# Patient Record
Sex: Female | Born: 1975 | Hispanic: No | Marital: Married | Smoking: Never smoker
Health system: Southern US, Community
[De-identification: ages and names within clinical notes are randomized; demographics above are authoritative.]

## PROBLEM LIST (undated history)

## (undated) HISTORY — PX: WRIST SURGERY: SHX841

---

## 2003-02-08 ENCOUNTER — Other Ambulatory Visit: Admission: RE | Admit: 2003-02-08 | Discharge: 2003-02-08 | Payer: Self-pay | Admitting: Gynecology

## 2004-02-29 ENCOUNTER — Other Ambulatory Visit: Admission: RE | Admit: 2004-02-29 | Discharge: 2004-02-29 | Payer: Self-pay | Admitting: Gynecology

## 2005-03-18 ENCOUNTER — Other Ambulatory Visit: Admission: RE | Admit: 2005-03-18 | Discharge: 2005-03-18 | Payer: Self-pay | Admitting: Gynecology

## 2007-05-14 ENCOUNTER — Emergency Department (HOSPITAL_COMMUNITY): Admission: EM | Admit: 2007-05-14 | Discharge: 2007-05-14 | Payer: Self-pay | Admitting: Emergency Medicine

## 2016-04-02 ENCOUNTER — Emergency Department (HOSPITAL_BASED_OUTPATIENT_CLINIC_OR_DEPARTMENT_OTHER): Payer: Managed Care, Other (non HMO)

## 2016-04-02 ENCOUNTER — Emergency Department (HOSPITAL_BASED_OUTPATIENT_CLINIC_OR_DEPARTMENT_OTHER)
Admission: EM | Admit: 2016-04-02 | Discharge: 2016-04-03 | Disposition: A | Discharge status: Home / Self Care | Payer: Managed Care, Other (non HMO) | Attending: Emergency Medicine | Admitting: Emergency Medicine

## 2016-04-02 ENCOUNTER — Encounter (HOSPITAL_BASED_OUTPATIENT_CLINIC_OR_DEPARTMENT_OTHER): Payer: Self-pay | Admitting: *Deleted

## 2016-04-02 DIAGNOSIS — R3915 Urgency of urination: Secondary | ICD-10-CM | POA: Insufficient documentation

## 2016-04-02 DIAGNOSIS — M545 Low back pain: Secondary | ICD-10-CM | POA: Diagnosis present

## 2016-04-02 DIAGNOSIS — N939 Abnormal uterine and vaginal bleeding, unspecified: Secondary | ICD-10-CM | POA: Insufficient documentation

## 2016-04-02 DIAGNOSIS — R109 Unspecified abdominal pain: Secondary | ICD-10-CM | POA: Diagnosis not present

## 2016-04-02 DIAGNOSIS — N949 Unspecified condition associated with female genital organs and menstrual cycle: Secondary | ICD-10-CM

## 2016-04-02 DIAGNOSIS — R110 Nausea: Secondary | ICD-10-CM | POA: Diagnosis not present

## 2016-04-02 LAB — URINE MICROSCOPIC-ADD ON

## 2016-04-02 LAB — URINALYSIS, ROUTINE W REFLEX MICROSCOPIC
Bilirubin Urine: NEGATIVE
GLUCOSE, UA: NEGATIVE mg/dL
Ketones, ur: NEGATIVE mg/dL
Nitrite: NEGATIVE
PH: 7 (ref 5.0–8.0)
Protein, ur: NEGATIVE mg/dL
Specific Gravity, Urine: 1.015 (ref 1.005–1.030)

## 2016-04-02 LAB — CBC WITH DIFFERENTIAL/PLATELET
BASOS PCT: 0 %
Basophils Absolute: 0 10*3/uL (ref 0.0–0.1)
EOS ABS: 0.2 10*3/uL (ref 0.0–0.7)
EOS PCT: 3 %
HCT: 41.2 % (ref 36.0–46.0)
HEMOGLOBIN: 13.7 g/dL (ref 12.0–15.0)
Lymphocytes Relative: 23 %
Lymphs Abs: 1.9 10*3/uL (ref 0.7–4.0)
MCH: 29.5 pg (ref 26.0–34.0)
MCHC: 33.3 g/dL (ref 30.0–36.0)
MCV: 88.8 fL (ref 78.0–100.0)
MONOS PCT: 10 %
Monocytes Absolute: 0.8 10*3/uL (ref 0.1–1.0)
Neutro Abs: 5.3 10*3/uL (ref 1.7–7.7)
Neutrophils Relative %: 64 %
PLATELETS: 372 10*3/uL (ref 150–400)
RBC: 4.64 MIL/uL (ref 3.87–5.11)
RDW: 13.3 % (ref 11.5–15.5)
WBC: 8.2 10*3/uL (ref 4.0–10.5)

## 2016-04-02 LAB — PREGNANCY, URINE: Preg Test, Ur: NEGATIVE

## 2016-04-02 NOTE — ED Triage Notes (Signed)
Back pain today. Urgency. [facility] She finished Macrobid yesterday.

## 2016-04-02 NOTE — ED Provider Notes (Signed)
[facility] Provider Note   CSN: [id number] Arrival date & time: 04/02/16  4098  By signing my name below, I, Emmanuella Mensah, attest that this documentation has been prepared under the direction and in the presence of Demetrios Loll, PA-C. Electronically Signed: Angelene Giovanni, ED Scribe. 04/02/16. 11:09 PM.   History   Chief Complaint Chief Complaint  Patient presents with  . Back Pain    HPI Comments: Brandy Estrada is a 40 y.o. female who presents to the Emergency Department complaining of persistent moderate urinary urgency and little urinary output onset 10 days ago. She explains that she was seen by a provider at an UC one week ago for these symptoms where she was given Macrobid. She states that she is here today because although she was complaint with the Macrobid and finished it yesterday, she had sudden onset of left lower back pain at 5:30 pm today. She adds that she had associated nausea. No alleviating factors noted. She states that she tried an OTC AZO which provided relief of her back pain and she was able to urinate here in the ED. Pt is currently on her menstrual cycle. She has NKDA. Denies any history of nephrolithiasis, or ovarian cyst. States she had UTI before and this is not as bad. She denies any fever, chills, ha, vision changes, the dizziness, lightheadedness, chest pain, shortness of breath, abdominal pain,emesis,dysuria, hematuria change in bowel habits, numbness/tingling, vaginal bleeding, vaginal discharge. Patient denies any pain or symptoms at this time.  The history is provided by the patient. No language interpreter was used.    History reviewed. No pertinent past medical history.  There are no active problems to display for this patient.   Past Surgical History:  Procedure Laterality Date  . WRIST SURGERY      OB History    No data available       Home Medications    Prior to Admission medications   Not on File    Family  History No family history on file.  Social History Social History  Substance Use Topics  . Smoking status: Never Smoker  . Smokeless tobacco: Never Used  . Alcohol use No     Allergies   Review of patient's allergies indicates no known allergies.   Review of Systems Review of Systems  Constitutional: Negative for chills and fever.  HENT: Negative for congestion and rhinorrhea.   Eyes: Negative for pain.  Respiratory: Negative for cough and shortness of breath.   Cardiovascular: Negative for chest pain and palpitations.  Gastrointestinal: Positive for nausea. Negative for abdominal pain and vomiting.  Genitourinary: Positive for flank pain, frequency, urgency and vaginal bleeding (On mestrual cycle). Negative for dysuria, hematuria and vaginal discharge.  Neurological: Negative for dizziness, syncope, light-headedness, numbness and headaches.  All other systems reviewed and are negative.    Physical Exam Updated Vital Signs BP 156/98 (BP Location: Right Arm)   Pulse 95   Temp 98.1 F (36.7 C) (Oral)   Resp 18   Ht 5\' 2"  (1.575 m)   Wt 244 lb (110.7 kg)   LMP 03/30/2016   SpO2 100%   BMI 44.63 kg/m   Physical Exam  Constitutional: She is oriented to person, place, and time. She appears well-developed and well-nourished. No distress.  HENT:  Head: Normocephalic and atraumatic.  Mouth/Throat: Uvula is midline, oropharynx is clear and moist and mucous membranes are normal.  Eyes: Conjunctivae and EOM are normal. Right eye exhibits no discharge. Left eye  exhibits no discharge. No scleral icterus.  Neck: Normal range of motion. Neck supple. No thyromegaly present.  Cardiovascular: Normal rate, regular rhythm, normal heart sounds and intact distal pulses.  Exam reveals no gallop and no friction rub.   No murmur heard. Pulmonary/Chest: Effort normal and breath sounds normal. No respiratory distress. She has no wheezes.  Abdominal: Soft. Bowel sounds are normal. She  exhibits no distension. There is no tenderness. There is no rebound, no guarding and no CVA tenderness.  Musculoskeletal: Normal range of motion.  Lymphadenopathy:    She has no cervical adenopathy.  Neurological: She is alert and oriented to person, place, and time.  Skin: Skin is warm and dry.  Psychiatric: She has a normal mood and affect. Judgment normal.  Nursing note and vitals reviewed.    ED Treatments / Results  DIAGNOSTIC STUDIES: Oxygen Saturation is 100% on RA, normal by my interpretation.    COORDINATION OF CARE: 11:08 PM- Pt advised of plan for treatment and pt agrees. Pt informed of her lab results. She will receive CT renal stone study for further evaluation.    Labs (all labs ordered are listed, but only abnormal results are displayed) Labs Reviewed  URINALYSIS, ROUTINE W REFLEX MICROSCOPIC (NOT AT [facility]) - Abnormal; Notable for the following:       Result Value   Hgb urine dipstick MODERATE (*)    Leukocytes, UA TRACE (*)    All other components within normal limits  URINE MICROSCOPIC-ADD ON - Abnormal; Notable for the following:    Squamous Epithelial / LPF 0-5 (*)    Bacteria, UA RARE (*)    All other components within normal limits  PREGNANCY, URINE  BASIC METABOLIC PANEL  CBC WITH DIFFERENTIAL/PLATELET    EKG  EKG Interpretation None       Radiology Ct Renal Stone Study  Result Date: 04/02/2016 CLINICAL DATA:  Acute onset of left lower back pain. Initial encounter. EXAM: CT ABDOMEN AND PELVIS WITHOUT CONTRAST TECHNIQUE: Multidetector CT imaging of the abdomen and pelvis was performed following the standard protocol without IV contrast. COMPARISON:  Lumbar spine radiographs performed 05/20/2007 FINDINGS: Lower chest: The visualized lung bases are grossly clear. The visualized portions of the mediastinum are unremarkable. Hepatobiliary: The liver is unremarkable in appearance. Stones are noted dependently within the gallbladder. The gallbladder is  otherwise unremarkable. The common bile duct remains normal in caliber. Pancreas: The pancreas is within normal limits. Spleen: The spleen is unremarkable in appearance. Adrenals/Urinary Tract: The adrenal glands are unremarkable in appearance. The kidneys are within normal limits. There is no evidence of hydronephrosis. No renal or ureteral stones are identified. No perinephric stranding is seen. Stomach/Bowel: The stomach is unremarkable in appearance. The small bowel is within normal limits. The appendix is normal in caliber, without evidence of appendicitis. The colon is unremarkable in appearance. Vascular/Lymphatic: The abdominal aorta is unremarkable in appearance. The inferior vena cava is grossly unremarkable. No retroperitoneal lymphadenopathy is seen. No pelvic sidewall lymphadenopathy is identified. Reproductive: The bladder is decompressed and not well assessed. The uterus is grossly unremarkable in appearance. A large 10.8 cm right adnexal cystic lesion is noted. The left ovary is unremarkable in appearance. Other: No additional soft tissue abnormalities are seen. Musculoskeletal: No acute osseous abnormalities are identified. The visualized musculature is unremarkable in appearance. IMPRESSION: 1. No definite acute abnormality seen to explain the patient's symptoms. 2. Large 10.8 cm right adnexal cystic lesion noted. Pelvic ultrasound is recommended for further evaluation, when and as  deemed clinically appropriate. 3. Cholelithiasis; gallbladder otherwise unremarkable. Electronically Signed   By: Roanna Raider M.D.   On: 04/02/2016 23:55    Procedures Procedures (including critical care time)  Medications Ordered in ED Medications - No data to display   Initial Impression / Assessment and Plan / ED Course  Demetrios Loll, PA-C has reviewed the triage vital signs and the nursing notes.  Pertinent labs & imaging results that were available during my care of the patient were reviewed by  me and considered in my medical decision making (see chart for details).  Clinical Course  Patient presents to ED today with urinary urgency. Patient with history of same and seen at urgent care last week. Encouraged to follow up with PCP but has not done so.patient with left flank pain today.Concern for nephrolithiasis. uA was without signs of infection. Blood was noted. Patient is currently on menstrual period. Pregnancy test was negative. Lab were unremarkable. Ct was without renal calculi. There was a large adnexal cyst seen on Ct. Patient with history of same but not sure of size of cyst. Cyst this large could be putting pressure on bladder causing urinary urgency and frequency. She has not seen ob/gyn in "years". Patient would like ultrasound of pelvis today. Patient given prescription for outpatient [place] today. She is encouraged to follow up with her ob/gyn with results. Patient also informed of cholelithiasis. Encouraged to follow up with pcp. If symptoms persists she needs to follow up with pcp. Dicussed patient with Dr. Read Drivers who is in agreeable with plan. Patient with elevated bp no history of hnt. Encouraged to follow up with pcp. Hemodynamically stable. Patient verbalized understanding with plan of care. Discharged home in NAD with stable vs.  Final Clinical Impressions(s) / ED Diagnoses   Final diagnoses:  Urinary urgency    New Prescriptions New Prescriptions   No medications on file  I personally performed the services described in this documentation, which was scribed in my presence. The recorded information has been reviewed and is accurate.    Rise Mu, PA-C 04/03/16 1308    Paula Libra, MD 04/03/16 [phone]

## 2016-04-02 NOTE — ED Notes (Signed)
Patient reports that she is having the feeling of needing to urinate frequently. However the patient reports that at times "nothing comes out" - the patient is sitting in bed in no distress. Denies any current pain.

## 2016-04-03 ENCOUNTER — Ambulatory Visit (HOSPITAL_BASED_OUTPATIENT_CLINIC_OR_DEPARTMENT_OTHER)
Admission: RE | Admit: 2016-04-03 | Discharge: 2016-04-03 | Disposition: A | Discharge status: Home / Self Care | Payer: Managed Care, Other (non HMO) | Source: Ambulatory Visit | Attending: Student | Admitting: Student

## 2016-04-03 ENCOUNTER — Ambulatory Visit (HOSPITAL_BASED_OUTPATIENT_CLINIC_OR_DEPARTMENT_OTHER)
Admit: 2016-04-03 | Discharge: 2016-04-03 | Disposition: A | Discharge status: Home / Self Care | Payer: Managed Care, Other (non HMO) | Attending: Student | Admitting: Student

## 2016-04-03 ENCOUNTER — Other Ambulatory Visit (HOSPITAL_BASED_OUTPATIENT_CLINIC_OR_DEPARTMENT_OTHER): Payer: Self-pay | Admitting: Student

## 2016-04-03 DIAGNOSIS — N83201 Unspecified ovarian cyst, right side: Secondary | ICD-10-CM | POA: Insufficient documentation

## 2016-04-03 DIAGNOSIS — N949 Unspecified condition associated with female genital organs and menstrual cycle: Secondary | ICD-10-CM

## 2016-04-03 LAB — BASIC METABOLIC PANEL
Anion gap: 7 (ref 5–15)
BUN: 10 mg/dL (ref 6–20)
CALCIUM: 9 mg/dL (ref 8.9–10.3)
CO2: 24 mmol/L (ref 22–32)
CREATININE: 0.63 mg/dL (ref 0.44–1.00)
Chloride: 108 mmol/L (ref 101–111)
GFR calc non Af Amer: >60 mL/min (ref >60)
GLUCOSE: 97 mg/dL (ref 65–99)
Potassium: 4.1 mmol/L (ref 3.5–5.1)
Sodium: 139 mmol/L (ref 135–145)

## 2016-04-03 NOTE — Discharge Instructions (Signed)
Please follow up with your primary doctor this next week. Return this AM for pelvic ultrasound. You need to follow up with your ob/gyn.

## 2017-12-25 IMAGING — CT CT RENAL STONE PROTOCOL
2 of 4 series · 16 of 46 positions shown, 18 images · non-contrast
Comparison: Lumbar spine radiographs performed 05/20/2007

CLINICAL DATA: Acute onset of left lower back pain. Initial
encounter.

EXAM:
CT ABDOMEN AND PELVIS WITHOUT CONTRAST
TECHNIQUE: Multidetector CT imaging of the abdomen and pelvis was performed
following the standard protocol without IV contrast.

[Series 2: axial st · axial · 0.89mm/px · z∈[+880,+1334]mm · 13 of 101 slices shown, 15 images]
[im 5/101  soft-tissue]
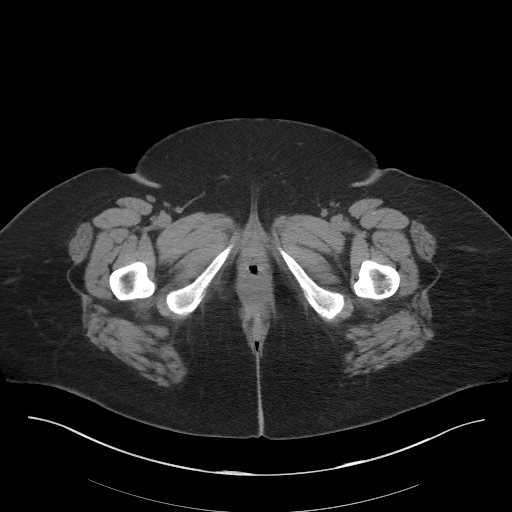
[im 5/101  bone]
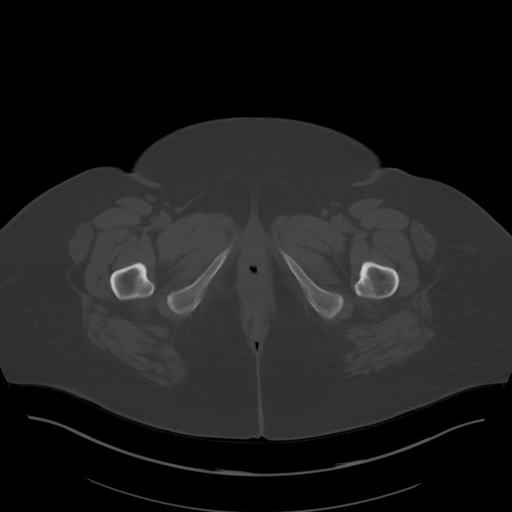
[im 13/101  soft-tissue]
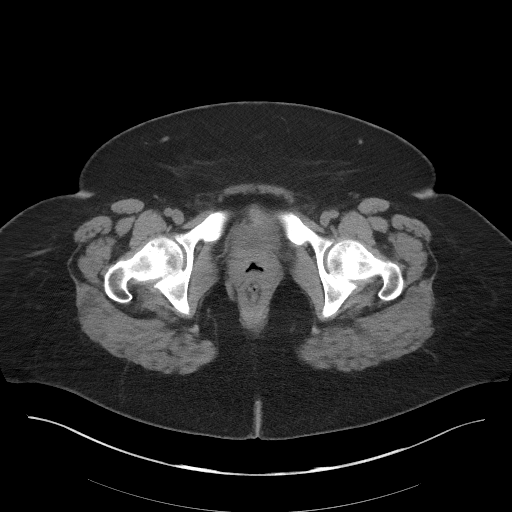
[im 21/101  soft-tissue]
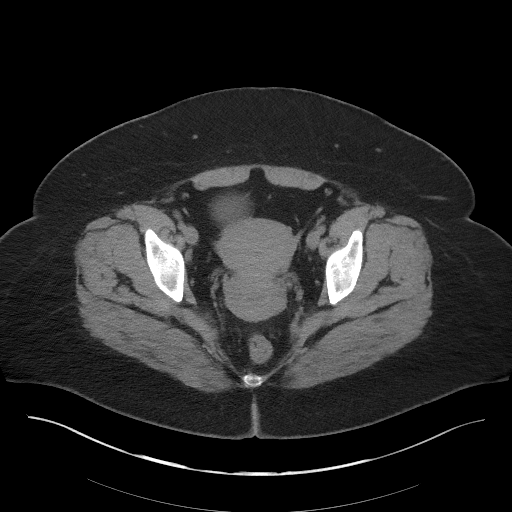
[im 30/101  soft-tissue]
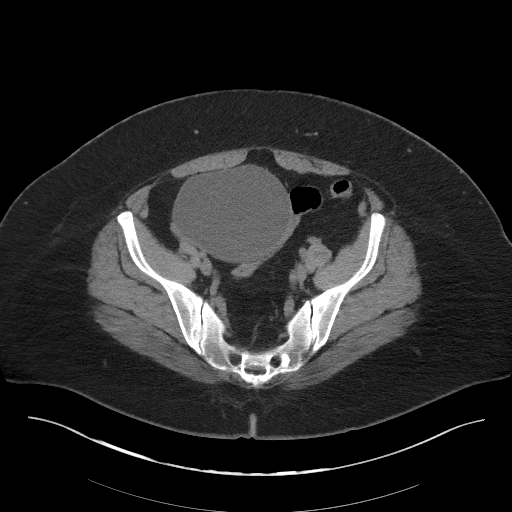
[im 34/101  soft-tissue]
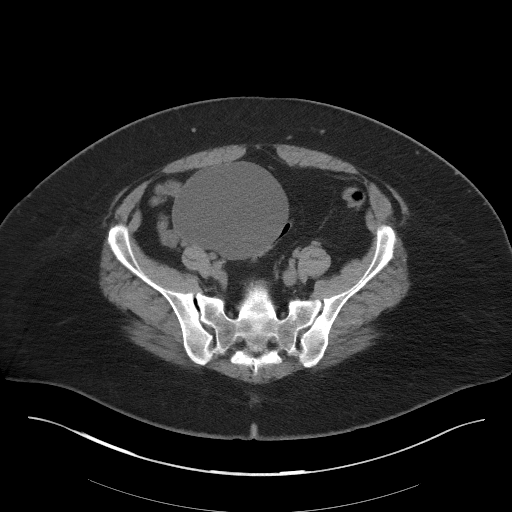
[im 42/101  soft-tissue]
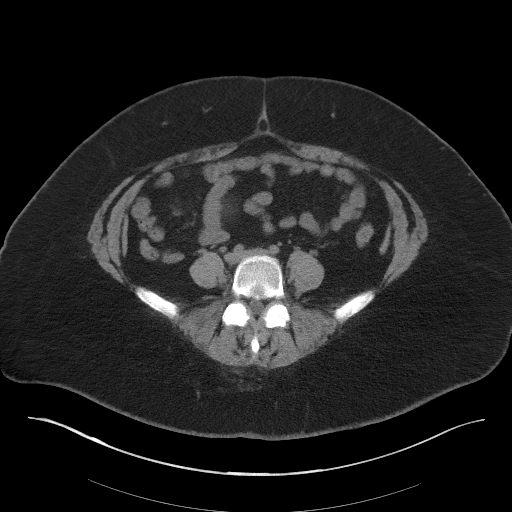
[im 51/101  soft-tissue]
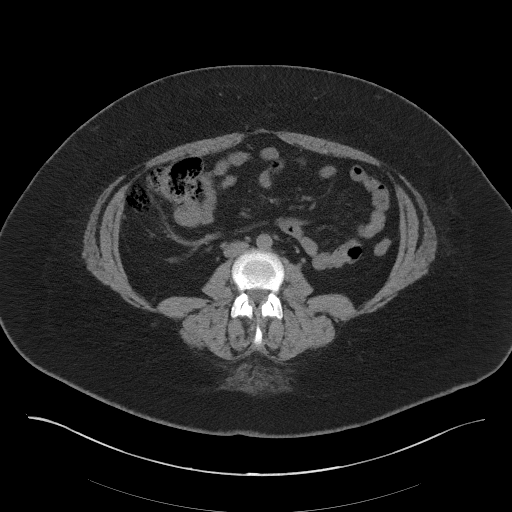
[im 59/101  soft-tissue]
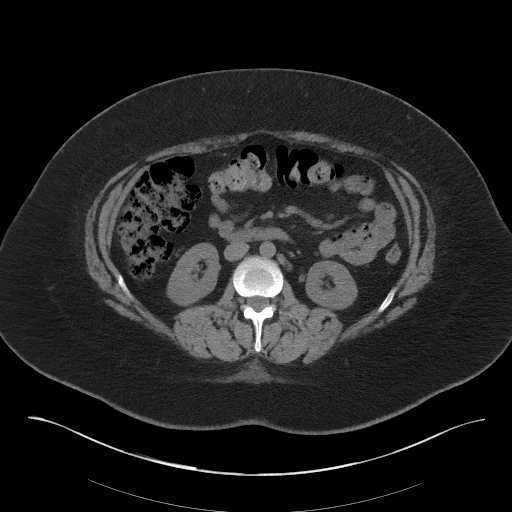
[im 67/101  soft-tissue]
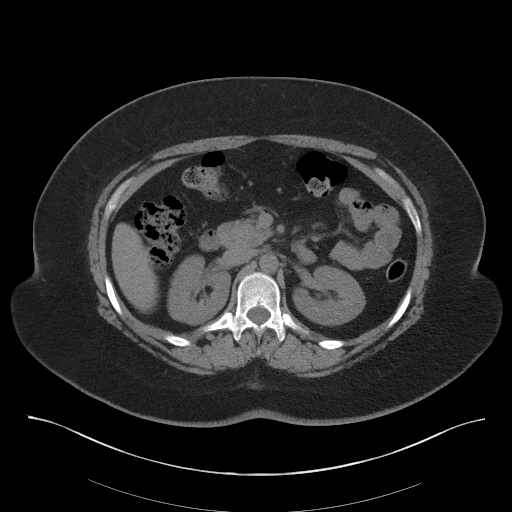
[im 67/101  bone]
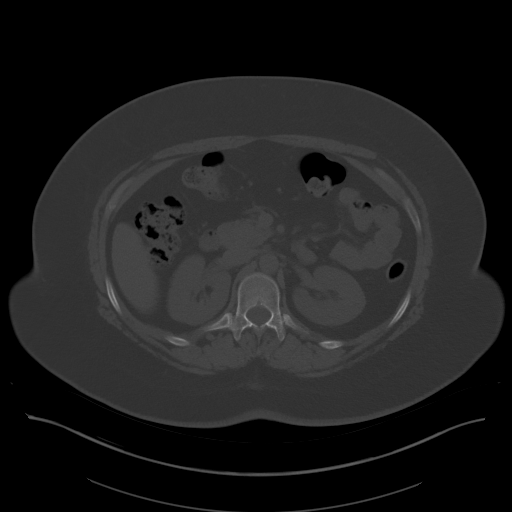
[im 71/101  soft-tissue]
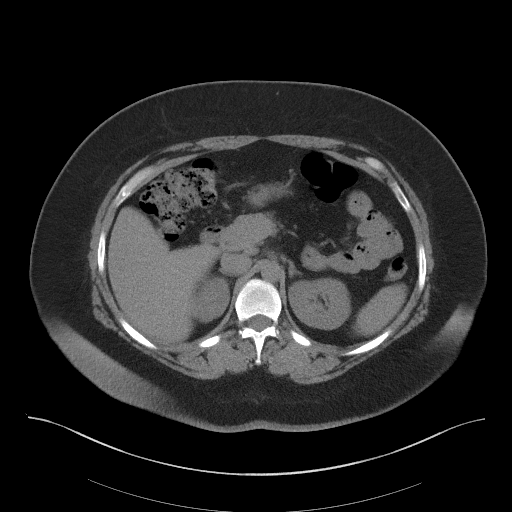
[im 80/101  soft-tissue]
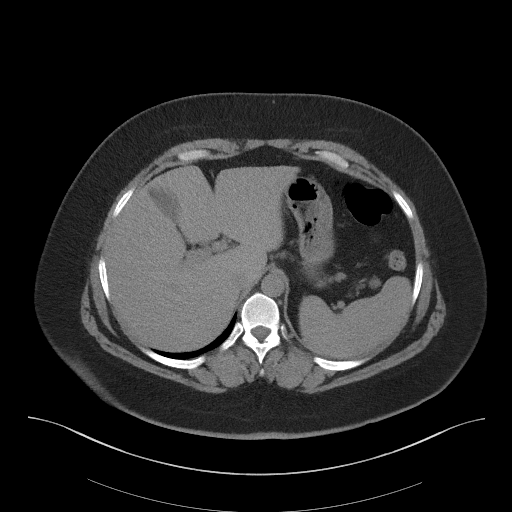
[im 88/101  soft-tissue]
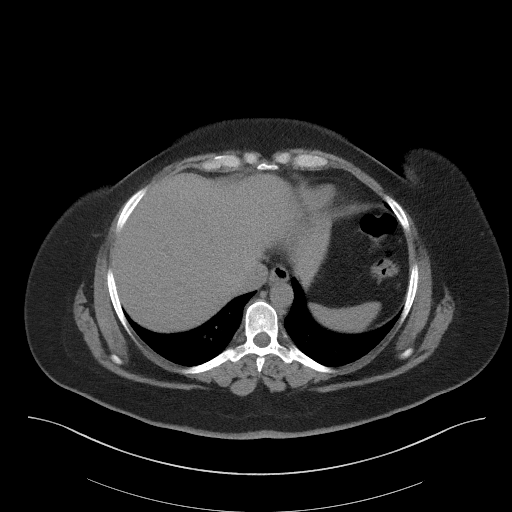
[im 96/101  soft-tissue]
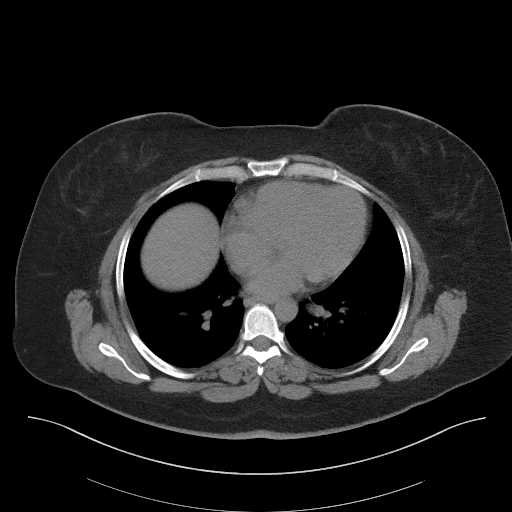

[Series 4: coronal st · coronal · 1.00mm/px · 3 of 113 slices shown]
[im 38/113  soft-tissue]
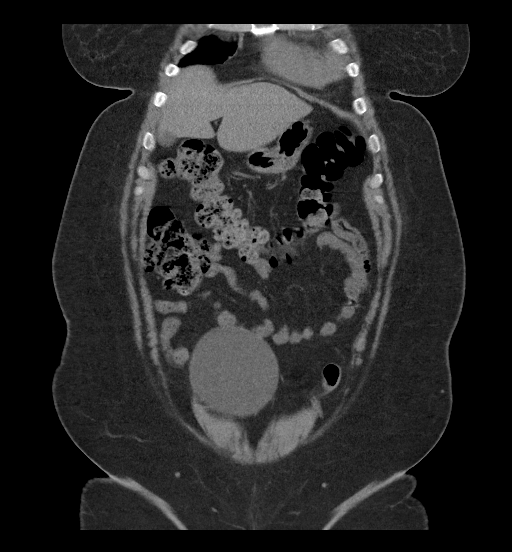
[im 50/113  soft-tissue]
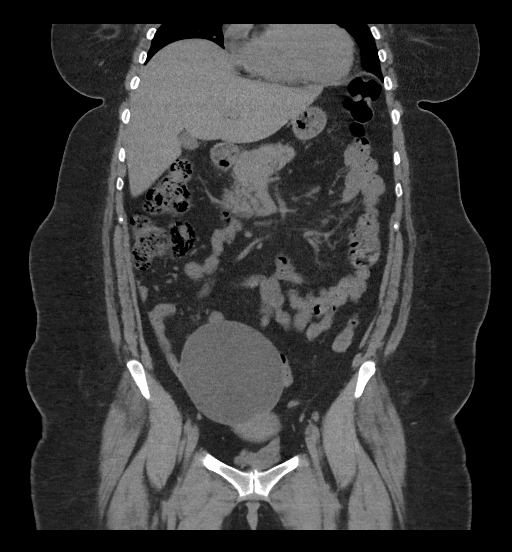
[im 63/113  soft-tissue]
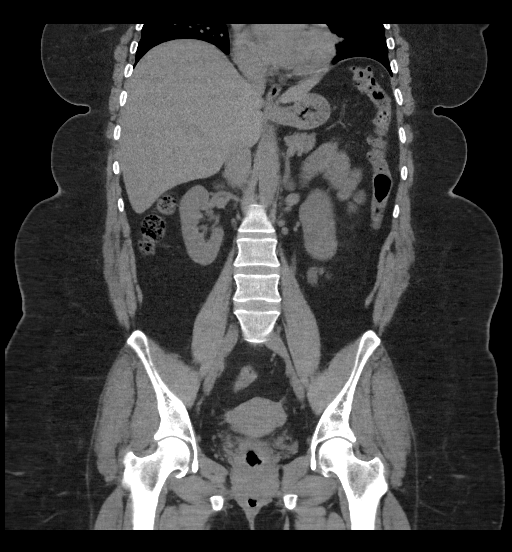

[16 of 46 positions shown; findings below may reference images not displayed]

FINDINGS: Lower chest: The visualized lung bases are grossly clear. The
visualized portions of the mediastinum are unremarkable.

Hepatobiliary: The liver is unremarkable in appearance. Stones are
noted dependently within the gallbladder. The gallbladder is
otherwise unremarkable. The common bile duct remains normal in
caliber.

Pancreas: The pancreas is within normal limits.

Spleen: The spleen is unremarkable in appearance.

Adrenals/Urinary Tract: The adrenal glands are unremarkable in
appearance. The kidneys are within normal limits. There is no
evidence of hydronephrosis. No renal or ureteral stones are
identified. No perinephric stranding is seen.

Stomach/Bowel: The stomach is unremarkable in appearance. The small
bowel is within normal limits. The appendix is normal in caliber,
without evidence of appendicitis. The colon is unremarkable in
appearance.

Vascular/Lymphatic: The abdominal aorta is unremarkable in
appearance. The inferior vena cava is grossly unremarkable. No
retroperitoneal lymphadenopathy is seen. No pelvic sidewall
lymphadenopathy is identified.

Reproductive: The bladder is decompressed and not well assessed. The
uterus is grossly unremarkable in appearance. A large 10.8 cm right
adnexal cystic lesion is noted. The left ovary is unremarkable in
appearance.

Other: No additional soft tissue abnormalities are seen.

Musculoskeletal: No acute osseous abnormalities are identified. The
visualized musculature is unremarkable in appearance.
IMPRESSION: 1. No definite acute abnormality seen to explain the patient's
symptoms.
2. Large 10.8 cm right adnexal cystic lesion noted. Pelvic
ultrasound is recommended for further evaluation, when and as deemed
clinically appropriate.
3. Cholelithiasis; gallbladder otherwise unremarkable.

## 2018-02-15 IMAGING — US US TRANSVAGINAL NON-OB
1 series · 13 of 25 positions shown · non-contrast
Comparison: CT 04/02/2016

CLINICAL DATA: Evaluate right adnexal cyst. Complains of urinary
frequency and back pain.

EXAM:
TRANSABDOMINAL AND TRANSVAGINAL ULTRASOUND OF PELVIS
DOPPLER ULTRASOUND OF OVARIES
TECHNIQUE: Both transabdominal and transvaginal ultrasound examinations of the
pelvis were performed. Transabdominal technique was performed for
global imaging of the pelvis including uterus, ovaries, adnexal
regions, and pelvic cul-de-sac.
It was necessary to proceed with endovaginal exam following the
transabdominal exam to visualize the adnexa. Color and duplex
Doppler ultrasound was utilized to evaluate blood flow to the
ovaries.

[Series 1: us transvaginal non-ob · 0.22mm/px · 13 of 87 slices shown]
[im 1/87]
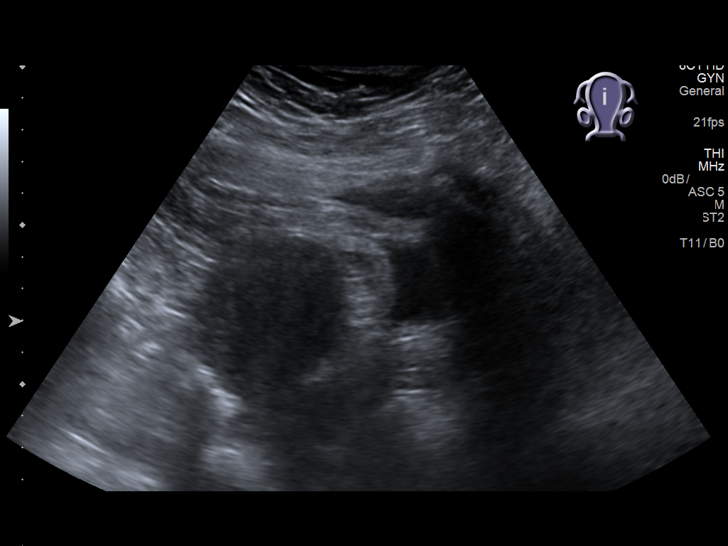
[im 8/87]
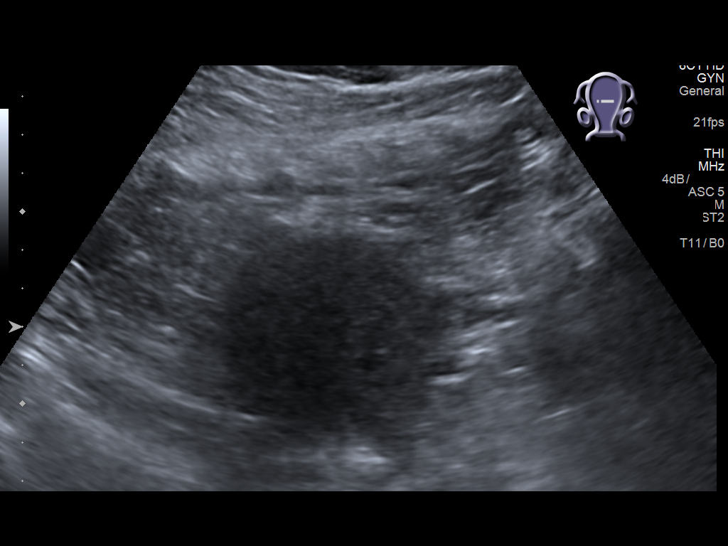
[im 15/87]
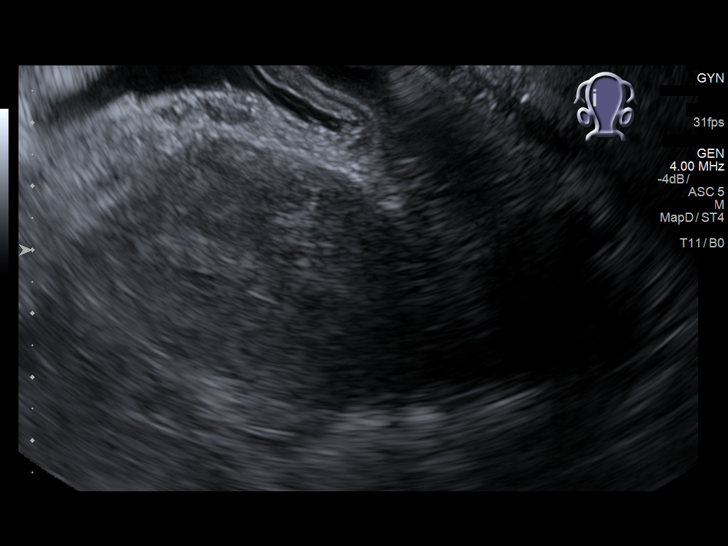
[im 22/87]
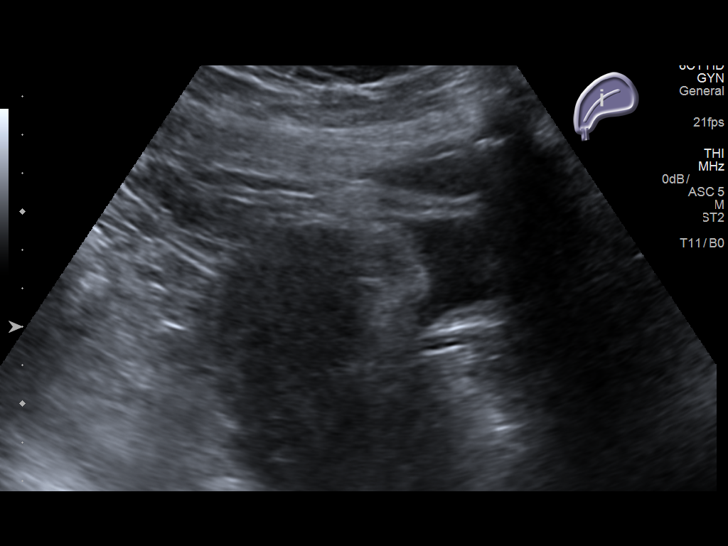
[im 29/87]
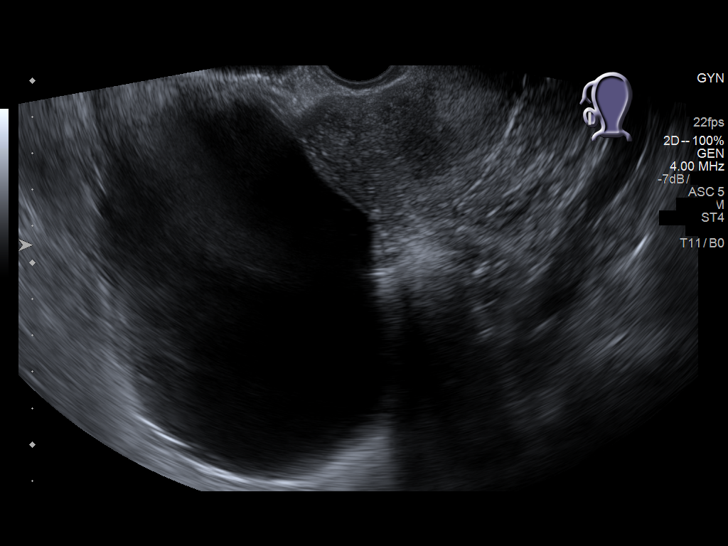
[im 36/87]
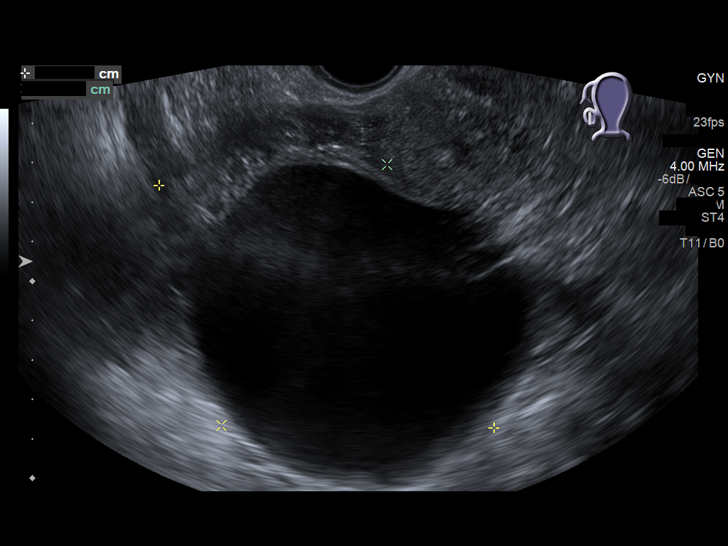
[im 44/87]
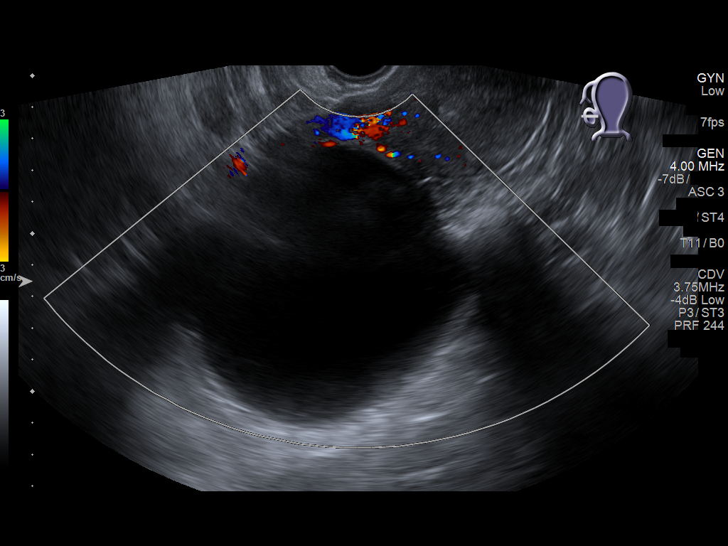
[im 51/87]
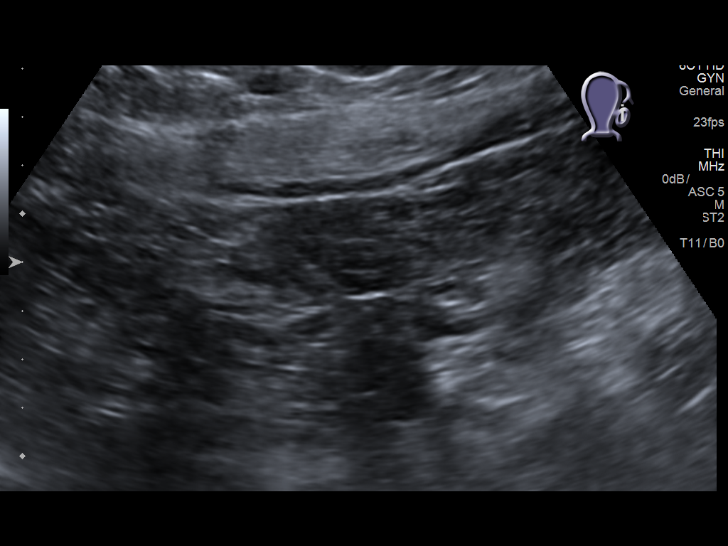
[im 58/87]
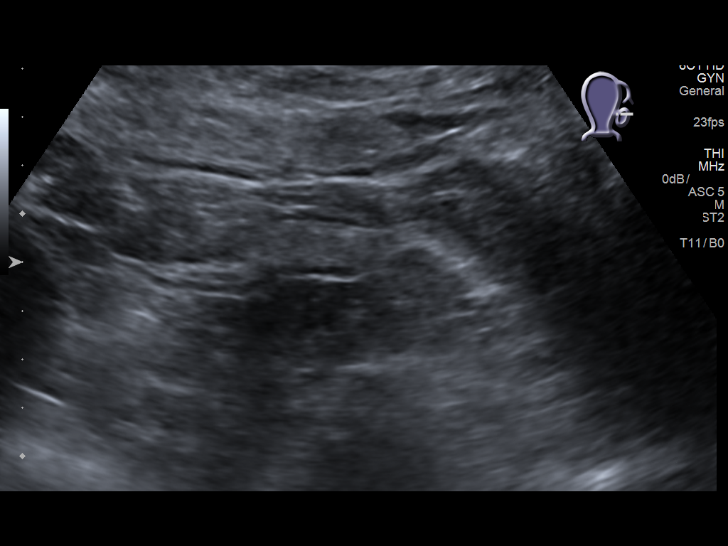
[im 65/87]
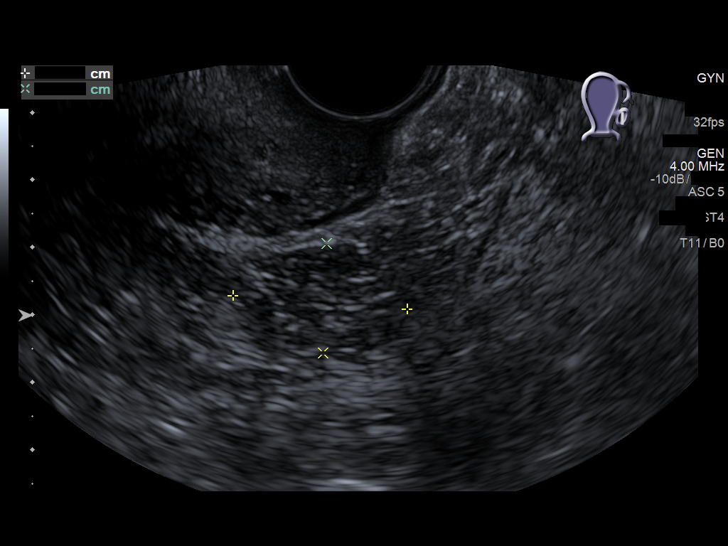
[im 72/87]
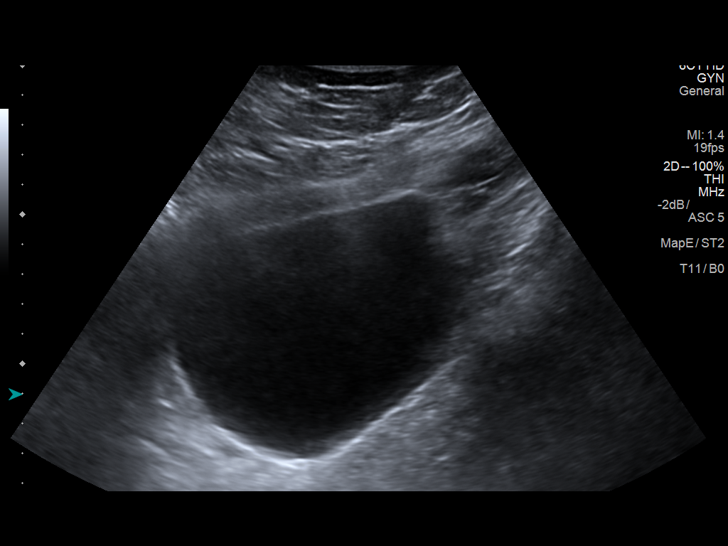
[im 79/87]
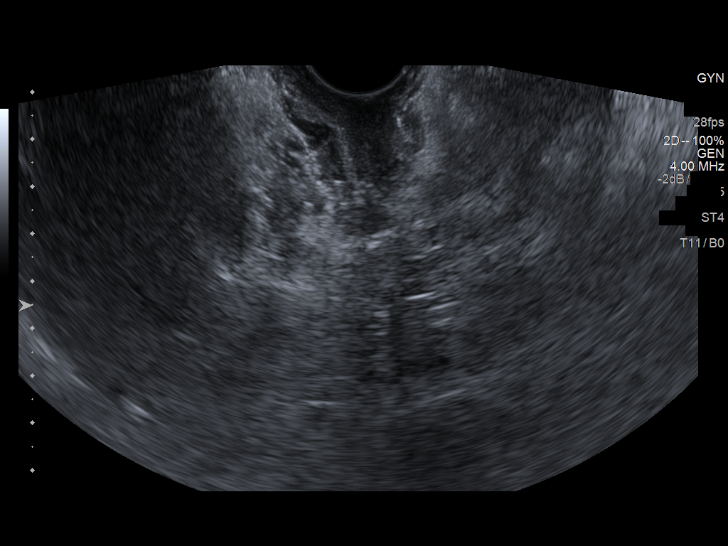
[im 87/87]
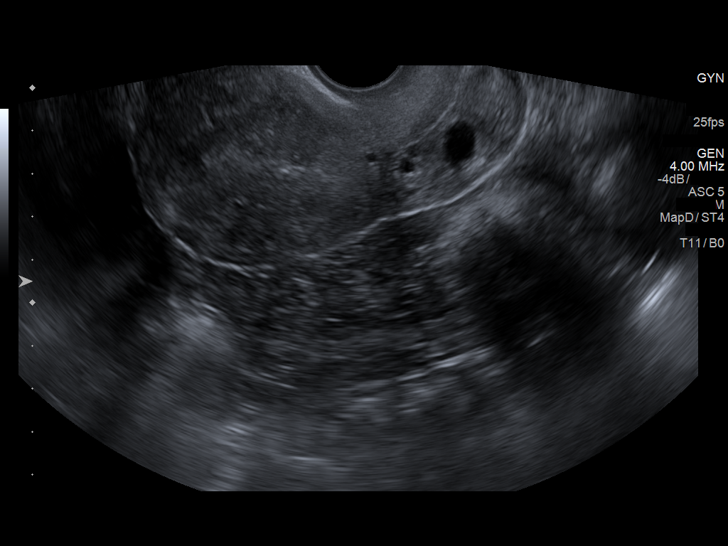

[13 of 25 positions shown; findings below may reference images not displayed]

FINDINGS: Uterus

Measurements: 8.6 x 4.8 x 5.0 cm. No fibroids or other mass
visualized. There are nabothian cysts.

Endometrium

Thickness: 0.8 cm.  No focal abnormality visualized.

Right ovary

Measurements: 10.5 x 7.8 x 10.7 cm. There is a large anechoic cyst
associated with the right ovary and right adnexa. This cyst measures
11.2 x 7.2 x 8.9 cm. There is no vascular flow within this cyst.
This cyst appear to be relatively simple but difficult to exclude a
small amount of echogenic debris. No large septations in the cyst.

Left ovary

Measurements: 2.6 x 1.6 x 1.9 cm. Normal appearance/no adnexal mass.

Pulsed Doppler evaluation of both ovaries demonstrates normal
low-resistance arterial and venous waveforms.

Other findings

No abnormal free fluid.
IMPRESSION: Large cyst involving the right ovary/adnexa. This cyst measures up
to 11.2 cm. Although this cyst appears to be relatively simple,
recommend gynecology consultation due to the size of this cyst.

Negative for ovarian torsion.

## 2021-10-30 ENCOUNTER — Ambulatory Visit: Payer: Self-pay | Admitting: Nurse Practitioner
